# Patient Record
Sex: Male | Born: 1985 | Race: White | Hispanic: No | Marital: Single
Health system: Southern US, Community
[De-identification: ages and names within clinical notes are randomized; demographics above are authoritative.]

---

## 2018-01-25 ENCOUNTER — Emergency Department (HOSPITAL_COMMUNITY)
Admission: EM | Admit: 2018-01-25 | Discharge: 2018-01-25 | Disposition: A | Discharge status: Home / Self Care | Payer: Self-pay | Attending: Emergency Medicine | Admitting: Emergency Medicine

## 2018-01-25 ENCOUNTER — Emergency Department (HOSPITAL_COMMUNITY): Payer: Self-pay

## 2018-01-25 ENCOUNTER — Encounter (HOSPITAL_COMMUNITY): Payer: Self-pay | Admitting: Emergency Medicine

## 2018-01-25 ENCOUNTER — Other Ambulatory Visit: Payer: Self-pay

## 2018-01-25 DIAGNOSIS — J189 Pneumonia, unspecified organism: Secondary | ICD-10-CM | POA: Insufficient documentation

## 2018-01-25 DIAGNOSIS — J069 Acute upper respiratory infection, unspecified: Secondary | ICD-10-CM | POA: Insufficient documentation

## 2018-01-25 LAB — GROUP A STREP BY PCR: GROUP A STREP BY PCR: NOT DETECTED

## 2018-01-25 MED ORDER — IBUPROFEN 200 MG PO TABS
600.0000 mg | ORAL_TABLET | Freq: Once | ORAL | Status: AC
  Administered 2018-01-25: 600 mg via ORAL
  Filled 2018-01-25: qty 3

## 2018-01-25 MED ORDER — LIDOCAINE VISCOUS HCL 2 % MT SOLN
15.0000 mL | Freq: Once | OROMUCOSAL | Status: AC
  Administered 2018-01-25: 15 mL via OROMUCOSAL
  Filled 2018-01-25: qty 15

## 2018-01-25 MED ORDER — AZITHROMYCIN 250 MG PO TABS: 250.0000 mg | ORAL_TABLET | Freq: Every day | ORAL | 0 refills | Status: AC

## 2018-01-25 NOTE — ED Provider Notes (Signed)
[facility]-EMERGENCY DEPT Provider Note   CSN: [id number] Arrival date & time: 01/25/18  1237     History   Chief Complaint Chief Complaint  Patient presents with  . Sore Throat    HPI Shawn Johnston is a 32 y.o. male.  Shawn Johnston is a 32 y.o. Male is otherwise healthy, presents to the emergency department for evaluation of 3 days of sore throat, cough and nasal congestion.  Patient reports since onset symptoms have been constant and worsening.  Cough is productive of mucus.  He has not had any fevers or chills and denies chest pain or shortness of breath.  No abdominal pain, nausea or vomiting.  Has been around several people with similar symptoms at work.  Has not had a flu shot yet this year.  Has been taking NyQuil and DayQuil with minimal relief in symptoms denies any other aggravating or alleviating factors.  Does use tobacco.  No history of reactive airway disease.     History reviewed. No pertinent past medical history.  There are no active problems to display for this patient.   History reviewed. No pertinent surgical history.      Home Medications    Prior to Admission medications   Medication Sig Start Date End Date Taking? Authorizing Provider  azithromycin (ZITHROMAX) 250 MG tablet Take 1 tablet (250 mg total) by mouth daily. Take first 2 tablets together, then 1 every day until finished. 01/25/18   Dartha Lodge, PA-C    Family History No family history on file.  Social History Social History   Tobacco Use  . Smoking status: Not on file  Substance Use Topics  . Alcohol use: Not on file  . Drug use: Not on file     Allergies   Patient has no known allergies.   Review of Systems Review of Systems  Constitutional: Negative for chills and fever.  HENT: Positive for congestion, rhinorrhea, sinus pressure, sneezing and sore throat. Negative for ear pain, trouble swallowing and voice change.   Eyes: Negative for discharge,  redness and itching.  Respiratory: Positive for cough. Negative for chest tightness and wheezing.   Cardiovascular: Negative for chest pain.  Gastrointestinal: Negative for abdominal pain, nausea and vomiting.  Skin: Negative for color change and rash.  Neurological: Negative for headaches.     Physical Exam Updated Vital Signs BP 140/81 (BP Location: Right Arm)   Pulse 82   Temp 98.8 F (37.1 C) (Oral)   Resp 16   SpO2 97%   Physical Exam  Constitutional: He appears well-developed and well-nourished. He does not appear ill. No distress.  HENT:  Head: Normocephalic and atraumatic.  Mouth/Throat: Uvula is midline and oropharynx is clear and moist. Tonsils are 0 on the right. Tonsils are 0 on the left. No tonsillar exudate.  TMs clear with good landmarks, moderate nasal mucosa edema with clear rhinorrhea, posterior oropharynx clear and moist, with some erythema, no edema or exudates, uvula midline  Eyes: Right eye exhibits no discharge. Left eye exhibits no discharge.  Neck: Normal range of motion. Neck supple.  No rigidity  Cardiovascular: Normal rate, regular rhythm, normal heart sounds and intact distal pulses.  Pulmonary/Chest: Effort normal and breath sounds normal. No respiratory distress.  Respirations equal and unlabored, patient able to speak in full sentences, lungs with a few scattered rhonchi throughout but otherwise clear with good air movement.  Abdominal: Soft. Bowel sounds are normal. He exhibits no distension and no mass. There is  no tenderness. There is no guarding.  Lymphadenopathy:    He has no cervical adenopathy.  Neurological: He is alert. Coordination normal.  Skin: Skin is warm and dry. Capillary refill takes less than 2 seconds. He is not diaphoretic.  Psychiatric: He has a normal mood and affect. His behavior is normal.  Nursing note and vitals reviewed.    ED Treatments / Results  Labs (all labs ordered are listed, but only abnormal results are  displayed) Labs Reviewed  GROUP A STREP BY PCR    EKG None  Radiology Dg Chest 2 View  Result Date: 01/25/2018 CLINICAL DATA:  32 year old male with sore throat cough and congestion for 3 days. Sick contacts. EXAM: CHEST - 2 VIEW COMPARISON:  None. FINDINGS: Lung volumes and mediastinal contours are normal. Visualized tracheal air column is within normal limits. No pneumothorax, pleural effusion, pulmonary edema or confluent pulmonary opacity. Borderline to mild increased bilateral pulmonary interstitial markings. Negative visible osseous structures and bowel gas pattern. IMPRESSION: Negative aside from borderline to mild increased interstitial opacity which might reflect viral/atypical respiratory infection. Electronically Signed   By: Odessa Fleming M.D.   On: 01/25/2018 14:33    Procedures Procedures (including critical care time)  Medications Ordered in ED Medications  ibuprofen (ADVIL,MOTRIN) tablet 600 mg (600 mg Oral Given 01/25/18 1348)  lidocaine (XYLOCAINE) 2 % viscous mouth solution 15 mL (15 mLs Mouth/Throat Given 01/25/18 1349)     Initial Impression / Assessment and Plan / ED Course  I have reviewed the triage vital signs and the nursing notes.  Pertinent labs & imaging results that were available during my care of the patient were reviewed by me and considered in my medical decision making (see chart for details).  Pt presents with nasal congestion and cough. Pt is well appearing and vitals are normal. Lungs few scattered rhonchi on exam.  Chest x-ray does show mild increased interstitial opacities that could reflect atypical or viral respiratory infection.  I suspect patient's symptoms are due to viral URI but given infiltrates noted on chest x-ray will treat with azithromycin for any atypical pneumonia.  Discussed appropriate symptomatic treatment as well.  Patient to follow-up with primary care doctor.  Return precautions have been discussed.  Patient expresses understanding  and is stable for discharge home at this time.  Final Clinical Impressions(s) / ED Diagnoses   Final diagnoses:  Upper respiratory tract infection, unspecified type  Atypical pneumonia    ED Discharge Orders         Ordered    azithromycin (ZITHROMAX) 250 MG tablet  Daily     01/25/18 1447           Dartha Lodge, [place] 01/25/18 1458    Loren Racer, MD 01/27/18 [phone]

## 2018-01-25 NOTE — Discharge Instructions (Addendum)
Your symptoms are likely caused by a viral upper respiratory infection, but you chest x-ray did show some mild infiltrates, please take Azithromycin as directed to treat for any atypical pneumonia. Please make sure you are drinking plenty of fluids. You can treat your symptoms supportively with tylenol/ibuprofen for fevers and pains, Zyrtec and Flonase to help with nasal congestion, and over the counter cough syrups and throat lozenges to help with cough. If your symptoms are not improving please follow up with you Primary doctor.   If you develop persistent fevers, shortness of breath or difficulty breathing, chest pain, severe headache and neck pain, persistent nausea and vomiting or other new or concerning symptoms return to the Emergency department.

## 2018-01-25 NOTE — ED Notes (Signed)
Patient transported to X-ray 

## 2018-01-25 NOTE — ED Triage Notes (Signed)
Patient c/o sore throat, cough, congestion x3 days. States he has been around others with similar symptoms.

## 2020-04-27 IMAGING — CR DG CHEST 2V
2 series · 2 of 2 positions shown · non-contrast
Comparison: None.

CLINICAL DATA: 31-year-old male with sore throat cough and
congestion for 3 days. Sick contacts.

EXAM:
CHEST - 2 VIEW

[w chest lat]
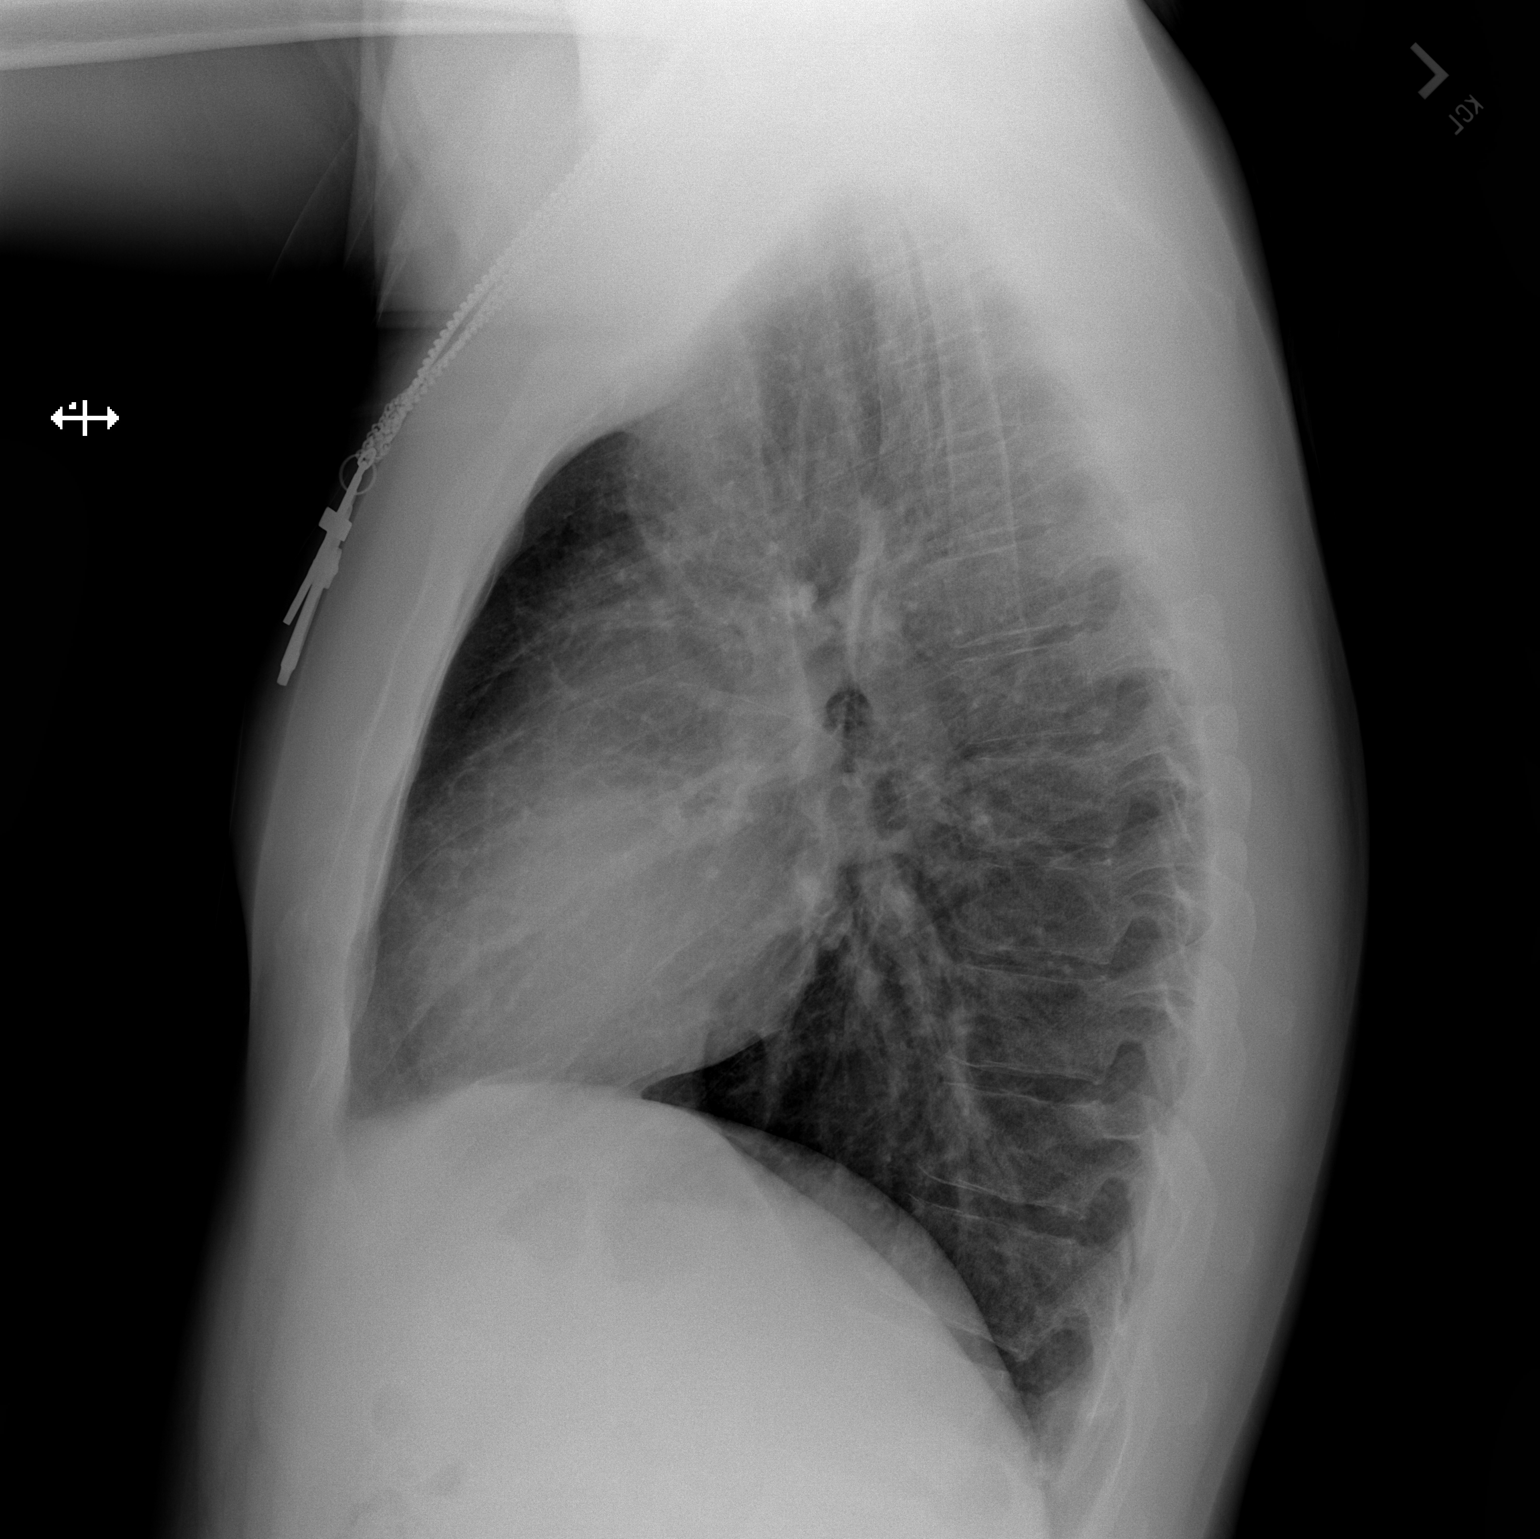

[w chest pa]
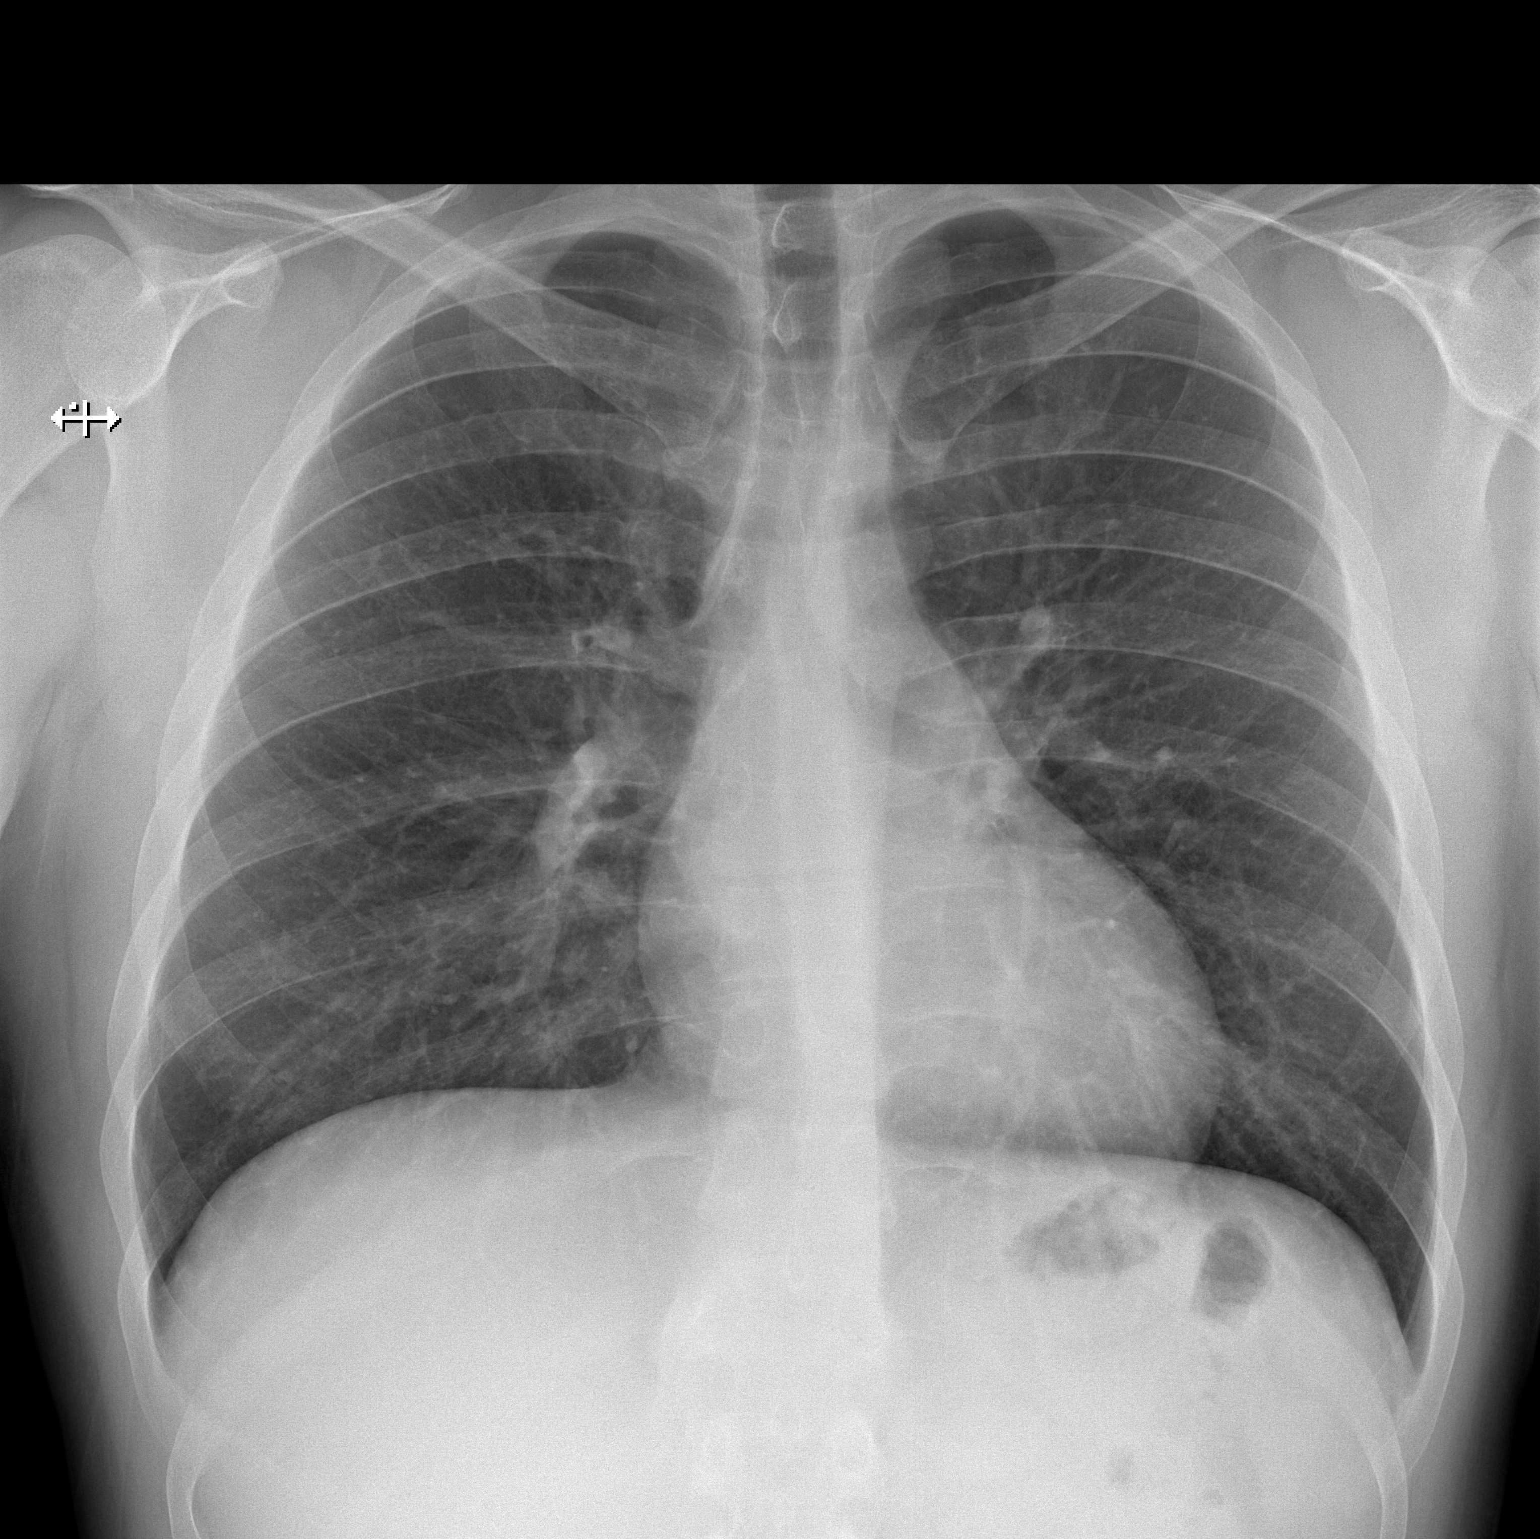

[2 of 2 positions shown; findings below may reference images not displayed]

FINDINGS: Lung volumes and mediastinal contours are normal. Visualized
tracheal air column is within normal limits. No pneumothorax,
pleural effusion, pulmonary edema or confluent pulmonary opacity.
Borderline to mild increased bilateral pulmonary interstitial
markings. Negative visible osseous structures and bowel gas pattern.
IMPRESSION: Negative aside from borderline to mild increased interstitial
opacity which might reflect viral/atypical respiratory infection.
